# Patient Record
Sex: Female | Born: 2000 | Race: White | Hispanic: No | Marital: Single | Smoking: Never smoker
Health system: Southern US, Community
[De-identification: ages and names within clinical notes are randomized; demographics above are authoritative.]

## PROBLEM LIST (undated history)

## (undated) DIAGNOSIS — R7303 Prediabetes: Secondary | ICD-10-CM

## (undated) DIAGNOSIS — F909 Attention-deficit hyperactivity disorder, unspecified type: Secondary | ICD-10-CM

## (undated) DIAGNOSIS — E049 Nontoxic goiter, unspecified: Secondary | ICD-10-CM

## (undated) DIAGNOSIS — E669 Obesity, unspecified: Secondary | ICD-10-CM

## (undated) DIAGNOSIS — E301 Precocious puberty: Secondary | ICD-10-CM

## (undated) HISTORY — DX: Precocious puberty: E30.1

## (undated) HISTORY — PX: WISDOM TOOTH EXTRACTION: SHX21

## (undated) HISTORY — DX: Nontoxic goiter, unspecified: E04.9

## (undated) HISTORY — DX: Obesity, unspecified: E66.9

## (undated) HISTORY — DX: Prediabetes: R73.03

## (undated) HISTORY — DX: Attention-deficit hyperactivity disorder, unspecified type: F90.9

---
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
## 2019-05-06 ENCOUNTER — Ambulatory Visit: Payer: PRIVATE HEALTH INSURANCE | Attending: Internal Medicine

## 2019-05-06 ENCOUNTER — Other Ambulatory Visit: Payer: Self-pay

## 2019-05-06 DIAGNOSIS — Z20822 Contact with and (suspected) exposure to covid-19: Secondary | ICD-10-CM

## 2019-05-08 LAB — NOVEL CORONAVIRUS, NAA: SARS-CoV-2, NAA: NOT DETECTED

## 2019-05-08 NOTE — Progress Notes (Signed)
Orders moved to this encounter.
